# Patient Record
Sex: Female | Born: 1940 | Race: Black or African American | Hispanic: No | Marital: Married | Smoking: Current every day smoker
Health system: Southern US, Community
[De-identification: ages and names within clinical notes are randomized; demographics above are authoritative.]

## PROBLEM LIST (undated history)

## (undated) DIAGNOSIS — I10 Essential (primary) hypertension: Secondary | ICD-10-CM

## (undated) DIAGNOSIS — E079 Disorder of thyroid, unspecified: Secondary | ICD-10-CM

## (undated) HISTORY — PX: BACK SURGERY: SHX140

---

## 2015-07-14 ENCOUNTER — Emergency Department: Payer: Medicare (Managed Care)

## 2015-07-14 ENCOUNTER — Emergency Department
Admission: EM | Admit: 2015-07-14 | Discharge: 2015-07-14 | Disposition: A | Discharge status: Home / Self Care | Payer: Medicare (Managed Care) | Attending: Emergency Medicine | Admitting: Emergency Medicine

## 2015-07-14 ENCOUNTER — Encounter: Payer: Self-pay | Admitting: *Deleted

## 2015-07-14 DIAGNOSIS — W1839XA Other fall on same level, initial encounter: Secondary | ICD-10-CM | POA: Diagnosis not present

## 2015-07-14 DIAGNOSIS — Y92828 Other wilderness area as the place of occurrence of the external cause: Secondary | ICD-10-CM | POA: Diagnosis not present

## 2015-07-14 DIAGNOSIS — Y998 Other external cause status: Secondary | ICD-10-CM | POA: Insufficient documentation

## 2015-07-14 DIAGNOSIS — I10 Essential (primary) hypertension: Secondary | ICD-10-CM | POA: Insufficient documentation

## 2015-07-14 DIAGNOSIS — Z720 Tobacco use: Secondary | ICD-10-CM | POA: Diagnosis not present

## 2015-07-14 DIAGNOSIS — Z880 Allergy status to penicillin: Secondary | ICD-10-CM | POA: Insufficient documentation

## 2015-07-14 DIAGNOSIS — Y9301 Activity, walking, marching and hiking: Secondary | ICD-10-CM | POA: Insufficient documentation

## 2015-07-14 DIAGNOSIS — S82851A Displaced trimalleolar fracture of right lower leg, initial encounter for closed fracture: Secondary | ICD-10-CM | POA: Insufficient documentation

## 2015-07-14 HISTORY — DX: Essential (primary) hypertension: I10

## 2015-07-14 HISTORY — DX: Disorder of thyroid, unspecified: E07.9

## 2015-07-14 MED ORDER — OXYCODONE-ACETAMINOPHEN 5-325 MG PO TABS: 1.0000 | ORAL_TABLET | Freq: Four times a day (QID) | ORAL | Status: AC | PRN

## 2015-07-14 MED ORDER — OXYCODONE-ACETAMINOPHEN 5-325 MG PO TABS
2.0000 | ORAL_TABLET | Freq: Once | ORAL | Status: AC
  Administered 2015-07-14: 2 via ORAL
  Filled 2015-07-14: qty 2

## 2015-07-14 MED ORDER — IBUPROFEN 800 MG PO TABS: 800.0000 mg | ORAL_TABLET | Freq: Once | ORAL | Status: DC

## 2015-07-14 MED ORDER — OXYCODONE-ACETAMINOPHEN 5-325 MG PO TABS: 2.0000 | ORAL_TABLET | Freq: Once | ORAL | Status: DC

## 2015-07-14 MED ORDER — NAPROXEN 500 MG PO TABS: 500.0000 mg | ORAL_TABLET | Freq: Two times a day (BID) | ORAL | Status: AC

## 2015-07-14 MED ORDER — IBUPROFEN 800 MG PO TABS
800.0000 mg | ORAL_TABLET | Freq: Once | ORAL | Status: AC
  Administered 2015-07-14: 800 mg via ORAL
  Filled 2015-07-14: qty 1

## 2015-07-14 NOTE — ED Provider Notes (Signed)
[facility] Emergency Department Provider Note  ____________________________________________  Time seen: 8:10 PM  I have reviewed the triage vital signs and the nursing notes.   HISTORY  Chief Complaint Ankle Pain    HPI Shannon Mcmillan is a 74 y.o. female who complains of right ankle pain after a fall at about 7:30 PM. She was walking on a hill when she fell with her foot bent backward under her. She had immediate pain and swelling in the right ankle. No numbness tingling or weakness in the toes. No coldness in the foot. No other pain or injury. She did not hit her head or lose consciousness. She braced herself with her hands and denies any pain in the hands wrists or arms.     Past Medical History  Diagnosis Date  . Thyroid disease   . Hypertension     There are no active problems to display for this patient.   Past Surgical History  Procedure Laterality Date  . Back surgery      No current outpatient prescriptions on file.  Allergies Amoxicillin and Doxycycline  No family history on file.  Social History History  Substance Use Topics  . Smoking status: Current Every Day Smoker  . Smokeless tobacco: Not on file  . Alcohol Use: No    Review of Systems  Constitutional: No fever or chills. No weight changes Eyes:No blurry vision or double vision.  ENT: No sore throat. Cardiovascular: No chest pain. Respiratory: No dyspnea or cough. Gastrointestinal: Negative for abdominal pain, vomiting and diarrhea.  No BRBPR or melena. Genitourinary: Negative for dysuria, urinary retention, bloody urine, or difficulty urinating. Musculoskeletal: Right ankle pain as above Skin: Negative for rash. Neurological: Negative for headaches, focal weakness or numbness. Psychiatric:No anxiety or depression.   Endocrine:No hot/cold intolerance, changes in energy, or sleep difficulty.  10-point ROS otherwise  negative.  ____________________________________________   PHYSICAL EXAM:  VITAL SIGNS: ED Triage Vitals  Enc Vitals Group     BP 07/14/15 2003 161/105 mmHg     Pulse Rate 07/14/15 2003 80     Resp 07/14/15 2003 16     Temp 07/14/15 2003 98.1 F (36.7 C)     Temp Source 07/14/15 2003 Oral     SpO2 07/14/15 2003 98 %     Weight 07/14/15 2003 206 lb (93.441 kg)     Height 07/14/15 2003 5\' 6"  (1.676 m)     Head Cir --      Peak Flow --      Pain Score 07/14/15 2004 10     Pain Loc --      Pain Edu? --      Excl. in GC? --      Constitutional: Alert and oriented. Well appearing and in no distress. Eyes: No scleral icterus. No conjunctival pallor. PERRL. EOMI ENT   Head: Normocephalic and atraumatic.   Nose: No congestion/rhinnorhea. No septal hematoma   Mouth/Throat: MMM, no pharyngeal erythema. No peritonsillar mass. No uvula shift.   Neck: No stridor. No SubQ emphysema. No meningismus. Hematological/Lymphatic/Immunilogical: No cervical lymphadenopathy. Cardiovascular: RRR. Normal and symmetric distal pulses are present in all extremities. No murmurs, rubs, or gallops. Respiratory: Normal respiratory effort without tachypnea nor retractions. Breath sounds are clear and equal bilaterally. No wheezes/rales/rhonchi. Gastrointestinal: Soft and nontender. No distention. There is no CVA tenderness.  No rebound, rigidity, or guarding. Genitourinary: deferred Musculoskeletal: Upper extremities normal and nontender bilaterally with normal range of motion. Left lower extremity is also normal.  Right lower extremity is normal at the knee and hip and femur. Distal tibia is tender to the touch, and there is tenderness on the medial and lateral malleoli and over the mortise space. No tenderness in the foot. Good DP pulse and cap refill in the toes. Normal sensation. Neurologic:   Normal speech and language.  CN 2-10 normal. Motor grossly intact. No gross focal neurologic  deficits are appreciated.  Skin:  Skin is warm, dry and intact. No rash noted.  No petechiae, purpura, or bullae. Psychiatric: Mood and affect are normal. Speech and behavior are normal. Patient exhibits appropriate insight and judgment.  ____________________________________________    LABS (pertinent positives/negatives) (all labs ordered are listed, but only abnormal results are displayed) Labs Reviewed - No data to display ____________________________________________   EKG    ____________________________________________    RADIOLOGY  X-ray right ankle interpreted by me, radiology report reviewed, reveals minimally displaced trimalleolar fracture of the right ankle.  ____________________________________________   PROCEDURES SPLINT APPLICATION Date/Time: 10:00 PM Authorized by: Sharman Cheek Consent: Verbal consent obtained. Risks and benefits: risks, benefits and alternatives were discussed Consent given by: patient Splint applied by: physician with assistance from orthopedic technician Location details: Right lower extremity  Splint type: Posterior plus sugar tong stirrup, 3 sided lower leg splint  Supplies used: Ortho-Glass, web roll, Ace wrap  Post-procedure: The splinted body part was neurovascularly unchanged following the procedure. Patient tolerance: Patient tolerated the procedure well with no immediate complications.  Foot held in slight dorsiflexion until Ortho-Glass splint material was secured to avoid acutely shortening.     ____________________________________________   INITIAL IMPRESSION / ASSESSMENT AND PLAN / ED COURSE  Pertinent labs & imaging results that were available during my care of the patient were reviewed by me and considered in my medical decision making (see chart for details).  Patient presents with trimalleolar fracture after fall. Orthopedics paged about 8:40 PM to discuss appropriate  management. ----------------------------------------- 10:01 PM on 07/14/2015 -----------------------------------------  Discussed with Dr. Hyacinth Meeker of orthopedics at 9:25 PM, recommends nonoperative management. We'll splint for now and have her follow-up in 4-5 days for reassessment and casting. The patient notes that she is currently on a road trip. She was driving from her home in [place] to [place], but now that she has had this injury, she'll plan to drive back home and follow-up with her doctor there and seek orthopedic follow-up there in [place]. She'll be unable to follow up with our local orthopedic surgeon. I did advise her that because of the injury and her upcoming car trip to be sure to keep the leg elevated at all times while in the car. She'll plan to sit in the back seat where she can both be secured and safe while also keeping her leg elevated to minimize any risk of DVT. She was counseled on crutch use by nursing, and is medically stable and suitable for discharge home. She is very functional and will be able to manage her injury outside of the hospital ____________________________________________   FINAL CLINICAL IMPRESSION(S) / ED DIAGNOSES  Final diagnoses:  Trimalleolar fracture of ankle, closed, right, initial encounter      Sharman Cheek, MD 07/14/15 2202

## 2015-07-14 NOTE — ED Notes (Signed)
Patient with no complaints at this time. Respirations even and unlabored. Skin warm/dry. Discharge instructions reviewed with patient at this time. Patient given opportunity to voice concerns/ask questions. Patient discharged at this time and left Emergency Department, via wheelchair.   

## 2015-07-14 NOTE — Discharge Instructions (Signed)
You were evaluated in the ER today for ankle pain after a fall. Your x-ray shows you have a 3 sided fracture of the ankle that is only mildly displaced. We discussed this with our orthopedic surgeon who recommends splinting for now, casting in 5-7 days, and nonsurgical management. Please avoid bearing weight on the right leg, keep the leg elevated, keep the splint in place, keep the splint clean and dry, and arrange follow-up with orthopedics in 4-5 days.  You were prescribed a medication that is potentially sedating. Do not drink alcohol, drive or participate in any other potentially dangerous activities while taking this medication as it may make you sleepy. Do not take this medication with any other sedating medications, either prescription or over-the-counter. If you were prescribed Percocet or Vicodin, do not take these with acetaminophen (Tylenol) as it is already contained within these medications.   Opioid pain medications (or "narcotics") can be habit forming.  Use it as little as possible to achieve adequate pain control.  Do not use or use it with extreme caution if you have a history of opiate abuse or dependence.  If you are on a pain contract with your primary care doctor or a pain specialist, be sure to let them know you were prescribed this medication today from the [facility] Emergency Department.  This medication is intended for your use only - do not give any to anyone else and keep it in a secure place where nobody else, especially children and pets, have access to it.  It will also cause or worsen constipation, so you may want to consider taking an over-the-counter stool softener while you are taking this medication.   Ankle Fracture A fracture is a break in a bone. The ankle joint is made up of three bones. These include the lower (distal)sections of your lower leg bones, called the tibia and fibula, along with a bone in your foot, called the talus. Depending on how bad the  break is and if more than one ankle joint bone is broken, a cast or splint is used to protect and keep your injured bone from moving while it heals. Sometimes, surgery is required to help the fracture heal properly.  There are two general types of fractures:  Stable fracture. This includes a single fracture line through one bone, with no injury to ankle ligaments. A fracture of the talus that does not have any displacement (movement of the bone on either side of the fracture line) is also stable.  Unstable fracture. This includes more than one fracture line through one or more bones in the ankle joint. It also includes fractures that have displacement of the bone on either side of the fracture line. CAUSES  A direct blow to the ankle.   Quickly and severely twisting your ankle.  Trauma, such as a car accident or falling from a significant height. RISK FACTORS You may be at a higher risk of ankle fracture if:  You have certain medical conditions.  You are involved in high-impact sports.  You are involved in a high-impact car accident. SIGNS AND SYMPTOMS   Tender and swollen ankle.  Bruising around the injured ankle.  Pain on movement of the ankle.  Difficulty walking or putting weight on the ankle.  A cold foot below the site of the ankle injury. This can occur if the blood vessels passing through your injured ankle were also damaged.  Numbness in the foot below the site of the ankle injury. DIAGNOSIS  An ankle fracture is usually diagnosed with a physical exam and X-rays. A CT scan may also be required for complex fractures. TREATMENT  Stable fractures are treated with a cast or splint and using crutches to avoid putting weight on your injured ankle. This is followed by an ankle strengthening program. Some patients require a special type of cast, depending on other medical problems they may have. Unstable fractures require surgery to ensure the bones heal properly. Your health  care provider will tell you what type of fracture you have and the best treatment for your condition. HOME CARE INSTRUCTIONS   Review correct crutch use with your health care provider and use your crutches as directed. Safe use of crutches is extremely important. Misuse of crutches can cause you to fall or cause injury to nerves in your hands or armpits.  Do not put weight or pressure on the injured ankle until directed by your health care provider.  To lessen the swelling, keep the injured leg elevated while sitting or lying down.  Apply ice to the injured area:  Put ice in a plastic bag.  Place a towel between your cast and the bag.  Leave the ice on for 20 minutes, 2-3 times a day.  If you have a plaster or fiberglass cast:  Do not try to scratch the skin under the cast with any objects. This can increase your risk of skin infection.  Check the skin around the cast every day. You may put lotion on any red or sore areas.  Keep your cast dry and clean.  If you have a plaster splint:  Wear the splint as directed.  You may loosen the elastic around the splint if your toes become numb, tingle, or turn cold or blue.  Do not put pressure on any part of your cast or splint; it may break. Rest your cast only on a pillow the first 24 hours until it is fully hardened.  Your cast or splint can be protected during bathing with a plastic bag sealed to your skin with medical tape. Do not lower the cast or splint into water.  Take medicines as directed by your health care provider. Only take over-the-counter or prescription medicines for pain, discomfort, or fever as directed by your health care provider.  Do not drive a vehicle until your health care provider specifically tells you it is safe to do so.  If your health care provider has given you a follow-up appointment, it is very important to keep that appointment. Not keeping the appointment could result in a chronic or permanent injury,  pain, and disability. If you have any problem keeping the appointment, call the facility for assistance. SEEK MEDICAL CARE IF: You develop increased swelling or discomfort. SEEK IMMEDIATE MEDICAL CARE IF:   Your cast gets damaged or breaks.  You have continued severe pain.  You develop new pain or swelling after the cast was put on.  Your skin or toenails below the injury turn blue or gray.  Your skin or toenails below the injury feel cold, numb, or have loss of sensitivity to touch.  There is a bad smell or pus draining from under the cast. MAKE SURE YOU:   Understand these instructions.  Will watch your condition.  Will get help right away if you are not doing well or get worse. Document Released: 11/23/2000 Document Revised: 12/01/2013 Document Reviewed: 06/25/2013 [facility] Patient Information 2015 [place], [place]. This information is not intended to replace advice given to  you by your health care provider. Make sure you discuss any questions you have with your health care provider.

## 2015-07-14 NOTE — ED Notes (Signed)
Pt arrives via EMS. Pt was walking down a hill and rolled her right ankle. C/o pain and swelling in the right ankle.

## 2016-02-27 IMAGING — CR DG ANKLE COMPLETE 3+V*R*
1 series · 3 of 3 positions shown · non-contrast
Comparison: None.

CLINICAL DATA: Acute onset of right ankle pain after falling down
[place]. Initial encounter.

EXAM:
RIGHT ANKLE - COMPLETE 3+ VIEW

[Series 1: dg ankle complete right · 0.14mm/px · 3 of 3 slices shown]
[im 1/3]
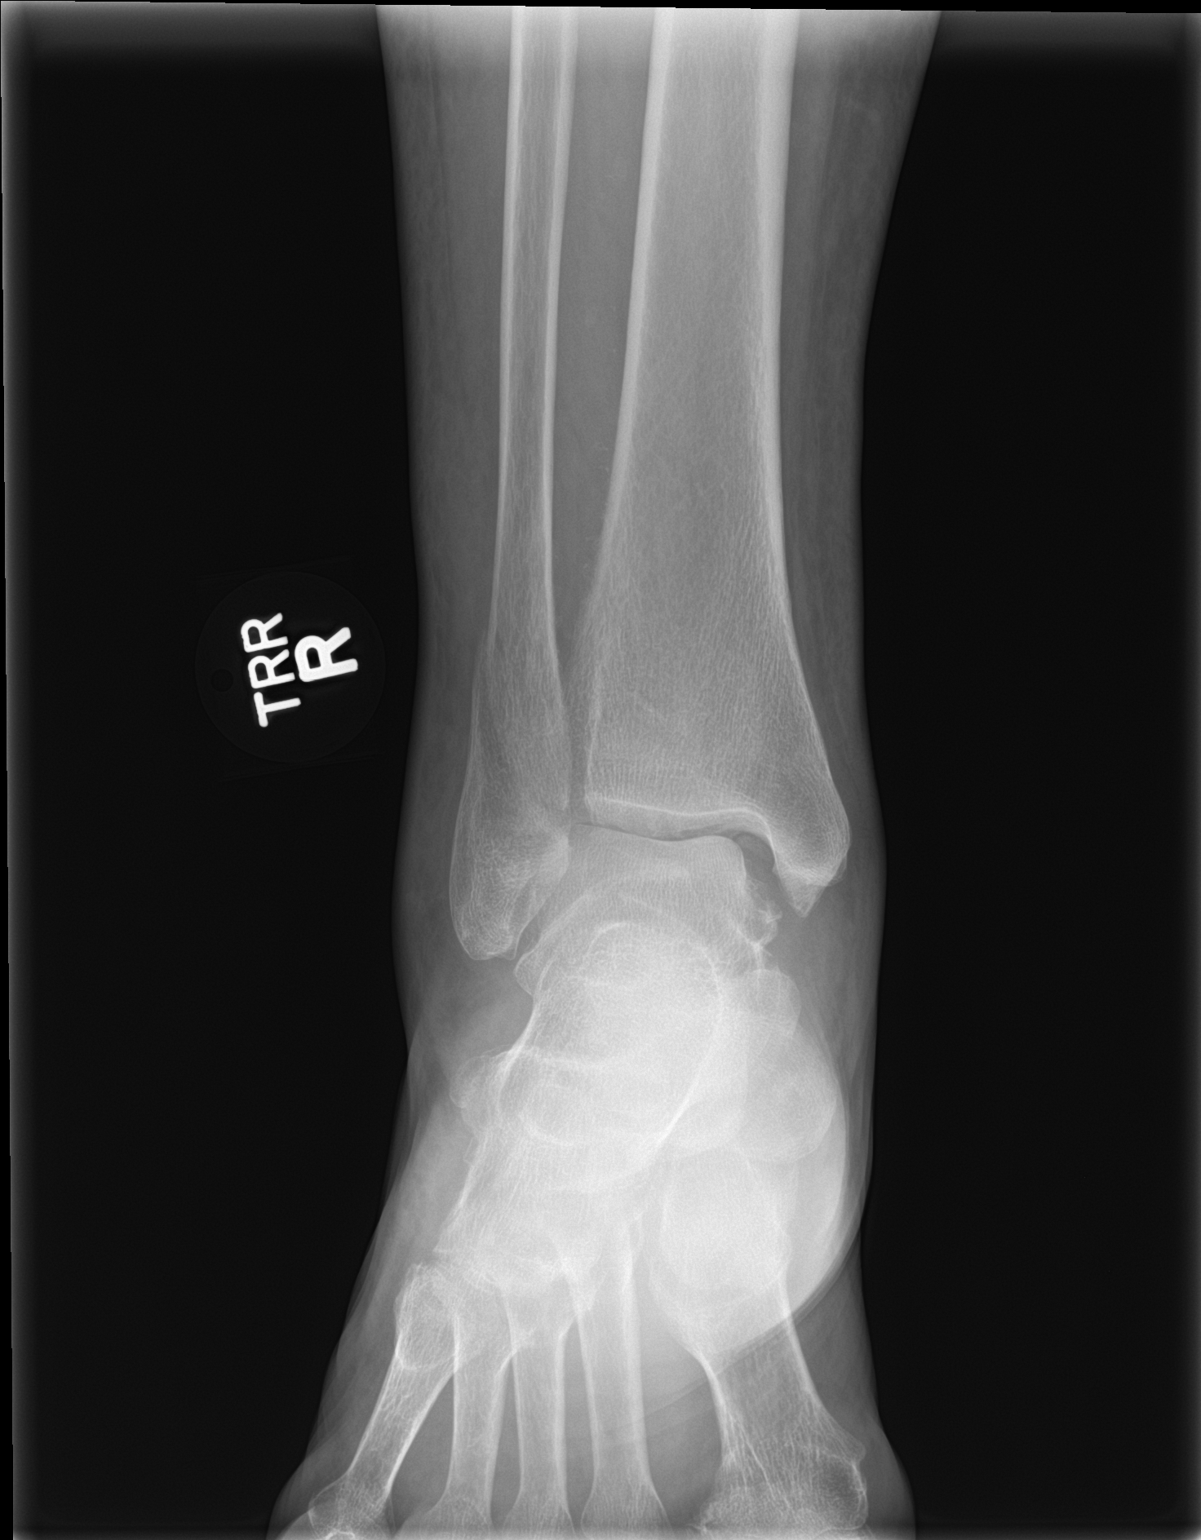
[im 2/3]
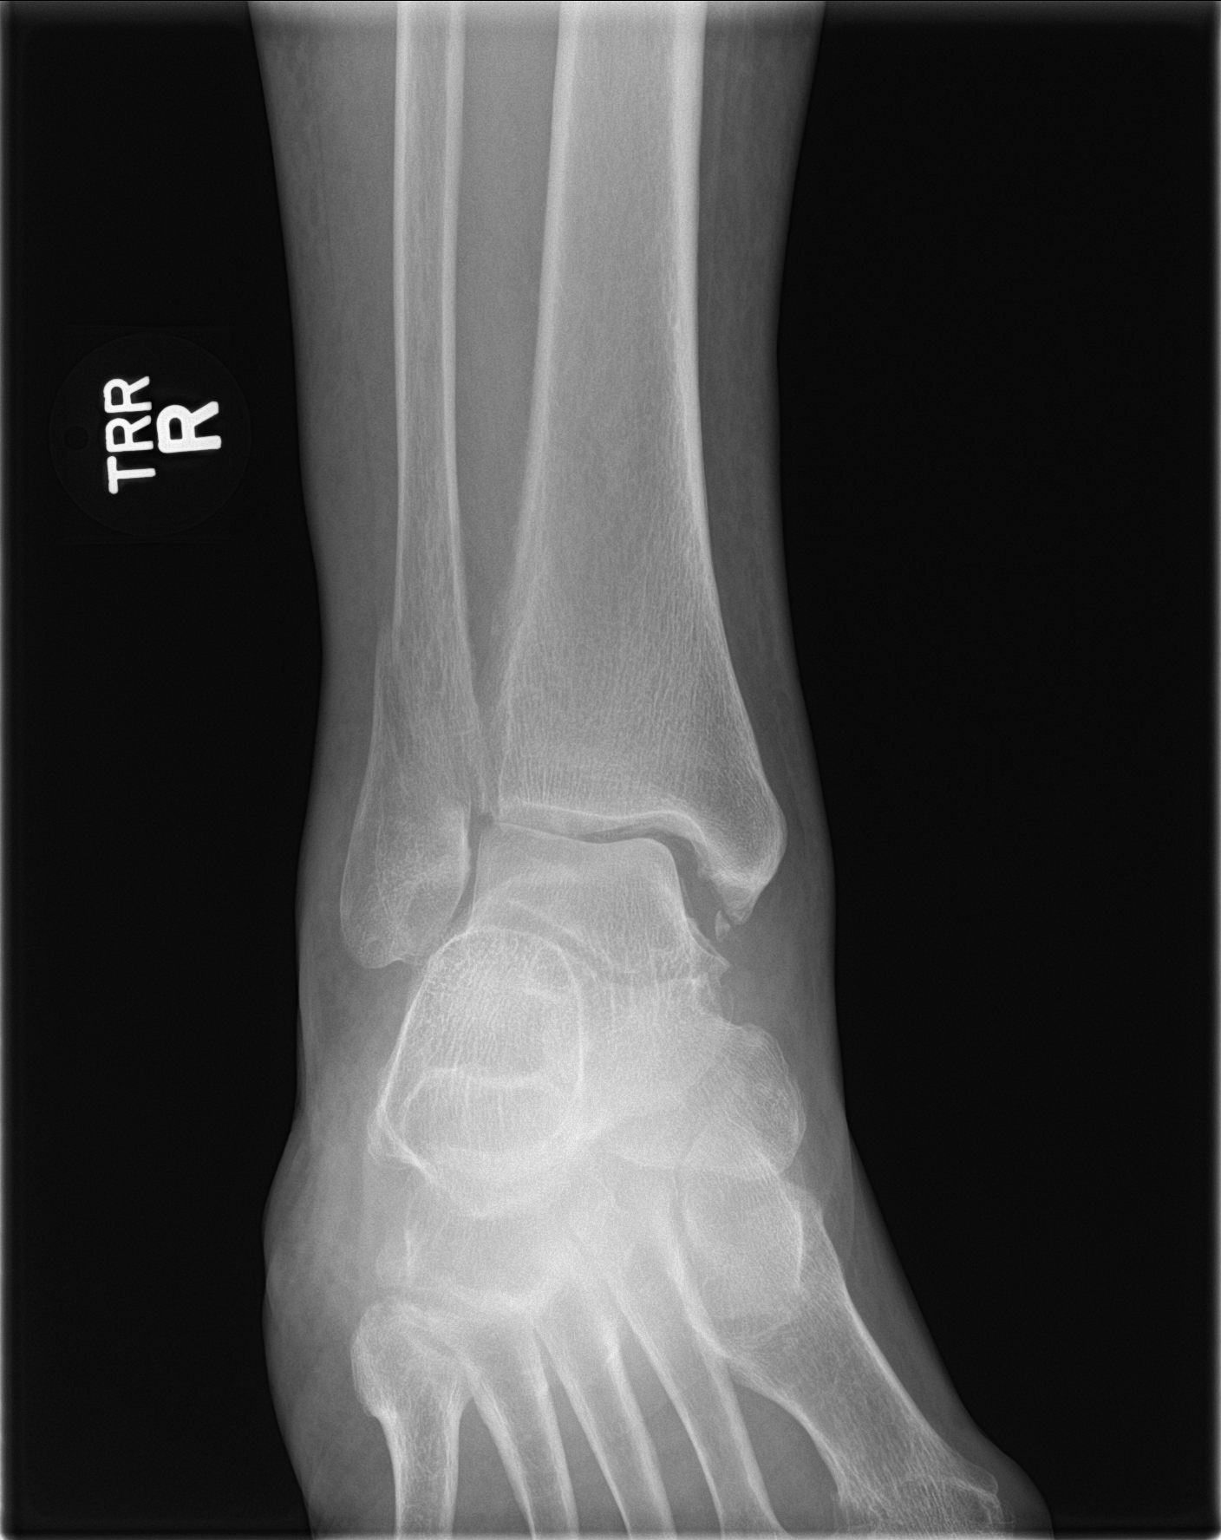
[im 3/3]
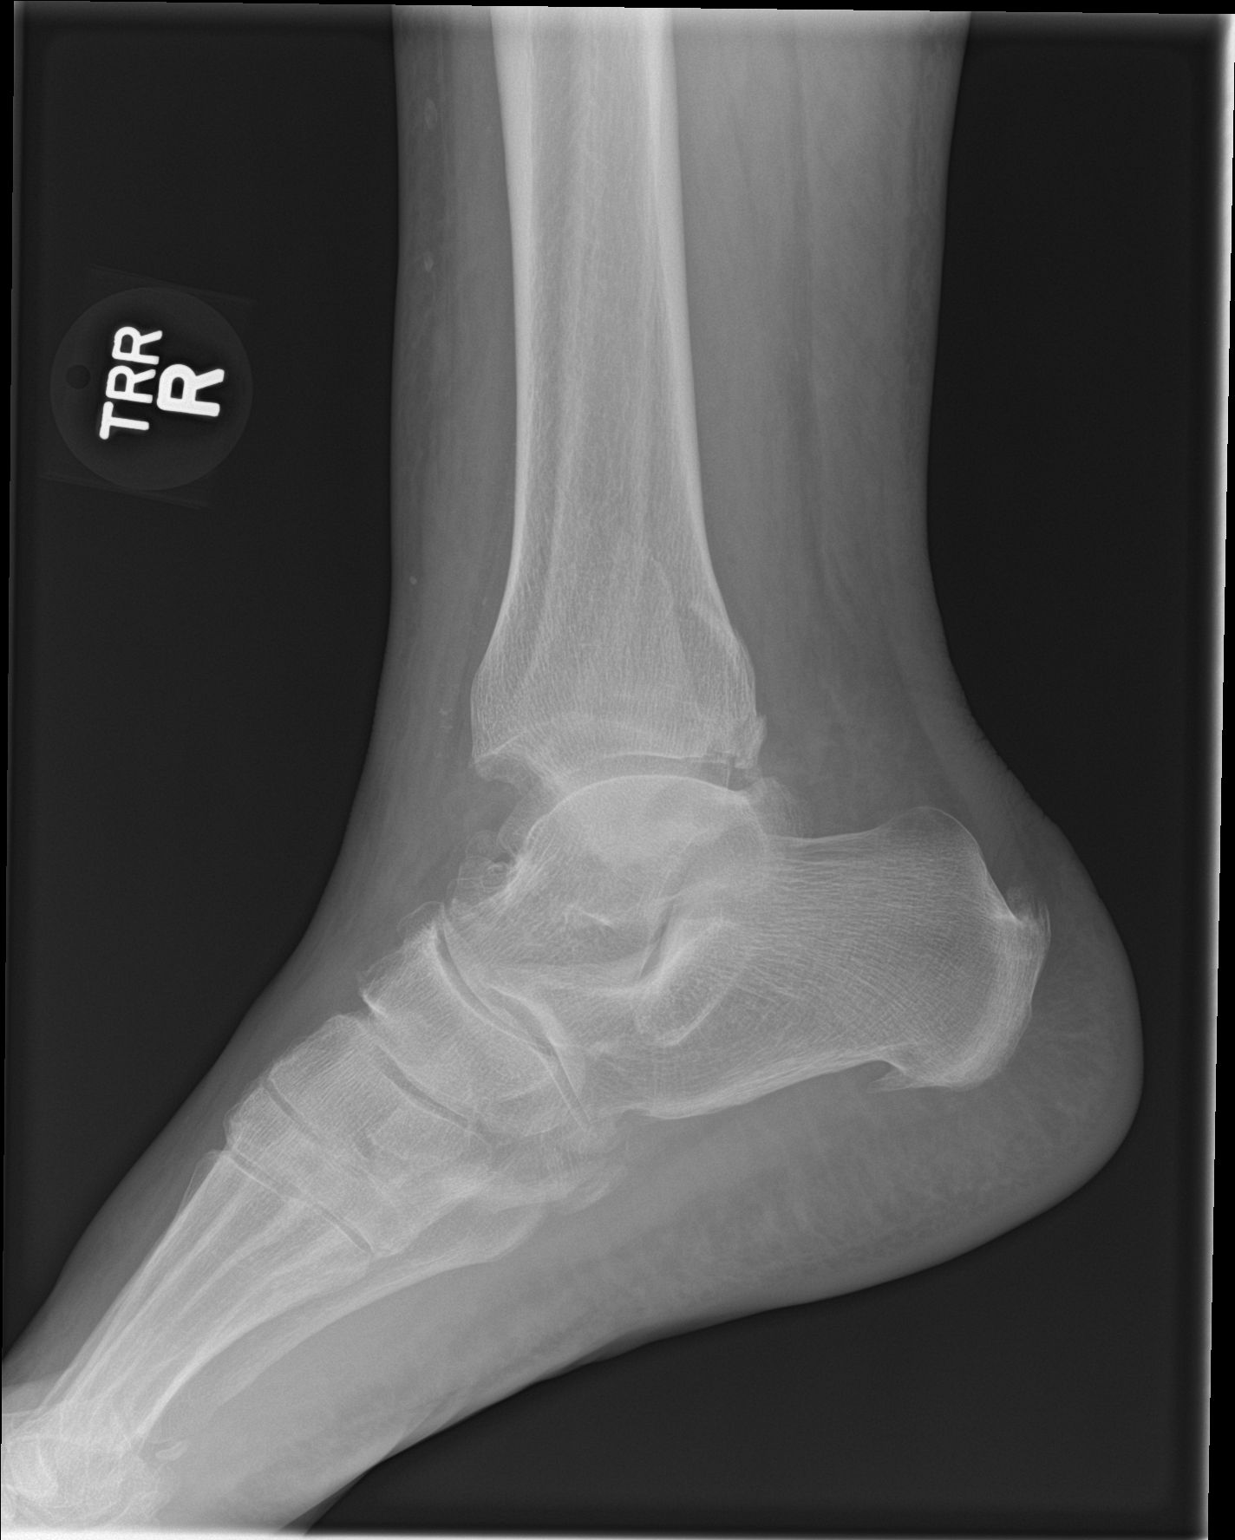

[3 of 3 positions shown; findings below may reference images not displayed]

FINDINGS: There is a mildly displaced oblique fracture through the distal
fibula, and a small fracture involving the distal tip of the medial
malleolus. There also appears to be a small fracture at the
posterior edge of the posterior malleolus. The interosseous space
remains grossly intact.

There is mild medial widening of the ankle mortise, reflecting
slight lateral displacement of the talus. Plantar and posterior
calcaneal spurs are seen. No additional fractures are seen.

Mild lateral soft tissue swelling is noted about the ankle.
IMPRESSION: Trimalleolar fracture, though the fractures of the medial malleolus
and posterior malleolus are relatively tiny. Mild medial widening of
the ankle mortise, reflecting slight lateral displacement of the
talus. The interosseous space appears grossly intact.
